# Patient Record
Sex: Female | Born: 1940 | Race: Black or African American | Hispanic: No | Marital: Married | Smoking: Never smoker
Health system: Southern US, Community
[De-identification: ages and names within clinical notes are randomized; demographics above are authoritative.]

## PROBLEM LIST (undated history)

## (undated) HISTORY — PX: THYROID SURGERY: SHX805

## (undated) HISTORY — PX: CORONARY ARTERY BYPASS GRAFT: SHX141

---

## 2019-02-04 ENCOUNTER — Emergency Department (HOSPITAL_BASED_OUTPATIENT_CLINIC_OR_DEPARTMENT_OTHER): Payer: Medicare HMO

## 2019-02-04 ENCOUNTER — Encounter (HOSPITAL_BASED_OUTPATIENT_CLINIC_OR_DEPARTMENT_OTHER): Payer: Self-pay

## 2019-02-04 ENCOUNTER — Emergency Department (HOSPITAL_COMMUNITY): Payer: Medicare HMO

## 2019-02-04 ENCOUNTER — Emergency Department (HOSPITAL_BASED_OUTPATIENT_CLINIC_OR_DEPARTMENT_OTHER)
Admission: EM | Admit: 2019-02-04 | Discharge: 2019-02-04 | Disposition: A | Discharge status: Home / Self Care | Payer: Medicare HMO | Attending: Emergency Medicine | Admitting: Emergency Medicine

## 2019-02-04 ENCOUNTER — Other Ambulatory Visit: Payer: Self-pay

## 2019-02-04 DIAGNOSIS — Z7901 Long term (current) use of anticoagulants: Secondary | ICD-10-CM | POA: Insufficient documentation

## 2019-02-04 DIAGNOSIS — M7989 Other specified soft tissue disorders: Secondary | ICD-10-CM

## 2019-02-04 DIAGNOSIS — Z951 Presence of aortocoronary bypass graft: Secondary | ICD-10-CM | POA: Diagnosis not present

## 2019-02-04 DIAGNOSIS — R2241 Localized swelling, mass and lump, right lower limb: Secondary | ICD-10-CM | POA: Insufficient documentation

## 2019-02-04 MED ORDER — HYDROCODONE-ACETAMINOPHEN 5-325 MG PO TABS
1.0000 | ORAL_TABLET | Freq: Once | ORAL | Status: AC
  Administered 2019-02-04: 1 via ORAL
  Filled 2019-02-04: qty 1

## 2019-02-04 NOTE — Discharge Instructions (Addendum)
Work-up for the right leg swelling x-rays negative for any bony injury.  Also Doppler study negative for any blood clots in the leg.  Results printed out for you follow-up with your doctor.

## 2019-02-04 NOTE — ED Triage Notes (Signed)
Pt c/o swelling to right LE x 6 days-noticed bluish discoloration x 4 days ago-denies injury to LE-NAD-steady gait

## 2019-02-04 NOTE — ED Provider Notes (Signed)
[facility] EMERGENCY DEPARTMENT Provider Note   CSN: [id number] Arrival date & time: 02/04/19  1717     History   Chief Complaint Chief Complaint  Patient presents with   Leg Swelling    HPI Wanda Grant is a 78 y.o. female.     Patient seen by her primary care doctor in the office this afternoon.  The concern is for some right leg swelling and sort of a bruising or bluish discolored station.  Patient denies any injuries.  Symptoms been present for 4 to 6 days.  Patient able to ambulate on it.  No chest pain no shortness of breath.  Patient is on the blood thinner Xarelto according to her for atrial fibrillation.     History reviewed. No pertinent past medical history.  There are no active problems to display for this patient.   Past Surgical History:  Procedure Laterality Date   CORONARY ARTERY BYPASS GRAFT     THYROID SURGERY       OB History   No obstetric history on file.      Home Medications    Prior to Admission medications   Not on File    Family History No family history on file.  Social History Social History   Tobacco Use   Smoking status: Never Smoker   Smokeless tobacco: Never Used  Substance Use Topics   Alcohol use: Never    Frequency: Never   Drug use: Never     Allergies   Patient has no known allergies.   Review of Systems Review of Systems  Constitutional: Negative for chills and fever.  HENT: Negative for congestion, rhinorrhea and sore throat.   Eyes: Negative for visual disturbance.  Respiratory: Negative for cough and shortness of breath.   Cardiovascular: Positive for leg swelling. Negative for chest pain.  Gastrointestinal: Negative for abdominal pain, diarrhea, nausea and vomiting.  Genitourinary: Negative for dysuria.  Musculoskeletal: Negative for back pain and neck pain.  Skin: Negative for rash.  Neurological: Negative for dizziness, light-headedness and headaches.  Hematological:  Bruises/bleeds easily.  Psychiatric/Behavioral: Negative for confusion.     Physical Exam Updated Vital Signs BP 133/88 (BP Location: Left Arm)    Pulse 88    Temp 97.8 F (36.6 C) (Oral)    Resp 20    Ht 1.702 m (5\' 7" )    Wt 73 kg    SpO2 98%    BMI 25.22 kg/m   Physical Exam Vitals signs and nursing note reviewed.  Constitutional:      General: She is not in acute distress.    Appearance: Normal appearance. She is well-developed.  HENT:     Head: Normocephalic and atraumatic.  Eyes:     Extraocular Movements: Extraocular movements intact.     Conjunctiva/sclera: Conjunctivae normal.     Pupils: Pupils are equal, round, and reactive to light.  Neck:     Musculoskeletal: Normal range of motion and neck supple.  Cardiovascular:     Rate and Rhythm: Normal rate and regular rhythm.     Heart sounds: No murmur.  Pulmonary:     Effort: Pulmonary effort is normal. No respiratory distress.     Breath sounds: Normal breath sounds.  Abdominal:     Palpations: Abdomen is soft.     Tenderness: There is no abdominal tenderness.  Musculoskeletal:        General: Swelling present. No tenderness.     Right lower leg: Edema present.  Comments: No swelling to the left lower extremity.  There is swelling to the right lower extremity around the ankle and foot and also up into the calf area.  There is some ecchymosis or bruising around the ankle area.  Good dorsalis pedis pulse.  Good cap refill to the toes.  No tenderness to palpation.  So no proximal leg tenderness either.  No swelling of the knee.  Skin:    General: Skin is warm and dry.     Capillary Refill: Capillary refill takes less than 2 seconds.  Neurological:     General: No focal deficit present.     Mental Status: She is alert and oriented to person, place, and time.      ED Treatments / Results  Labs (all labs ordered are listed, but only abnormal results are displayed) Labs Reviewed - No data to  display  EKG None  Radiology Dg Ankle Complete Right  Result Date: 02/04/2019 CLINICAL DATA:  Right lower extremity swelling.  No reported injury. EXAM: RIGHT ANKLE - COMPLETE 3+ VIEW COMPARISON:  None. FINDINGS: Diffuse soft tissue swelling. No fracture or subluxation. No suspicious focal osseous lesion. No osseous erosions. Vascular calcifications. No radiopaque foreign body. IMPRESSION: Diffuse soft tissue swelling, with no acute osseous abnormality. Electronically Signed   By: Delbert PhenixJason A Poff M.D.   On: 02/04/2019 18:21   [place] Venous Img Lower Unilateral Right  Result Date: 02/04/2019 CLINICAL DATA:  Right lower extremity pain and swelling EXAM: RIGHT LOWER EXTREMITY VENOUS DOPPLER ULTRASOUND TECHNIQUE: Gray-scale sonography with graded compression, as well as color Doppler and duplex ultrasound were performed to evaluate the lower extremity deep venous systems from the level of the common femoral vein and including the common femoral, femoral, profunda femoral, popliteal and calf veins including the posterior tibial, peroneal and gastrocnemius veins when visible. The superficial great saphenous vein was also interrogated. Spectral Doppler was utilized to evaluate flow at rest and with distal augmentation maneuvers in the common femoral, femoral and popliteal veins. COMPARISON:  None. FINDINGS: Contralateral Common Femoral Vein: Respiratory phasicity is normal and symmetric with the symptomatic side. No evidence of thrombus. Normal compressibility. Common Femoral Vein: No evidence of thrombus. Normal compressibility, respiratory phasicity and response to augmentation. Saphenofemoral Junction: No evidence of thrombus. Normal compressibility and flow on color Doppler imaging. Profunda Femoral Vein: No evidence of thrombus. Normal compressibility and flow on color Doppler imaging. Femoral Vein: No evidence of thrombus. Normal compressibility, respiratory phasicity and response to augmentation. Popliteal  Vein: No evidence of thrombus. Normal compressibility, respiratory phasicity and response to augmentation. Calf Veins: No evidence of thrombus. Normal compressibility and flow on color Doppler imaging. Superficial Great Saphenous Vein: No evidence of thrombus. Normal compressibility. Venous Reflux:  None. Other Findings:  None. IMPRESSION: No evidence of deep venous thrombosis. Electronically Signed   By: Jonna ClarkBindu  Avutu M.D.   On: 02/04/2019 19:30    Procedures Procedures (including critical care time)  Medications Ordered in ED Medications  HYDROcodone-acetaminophen (NORCO/VICODIN) 5-325 MG per tablet 1 tablet (1 tablet Oral Given 02/04/19 1815)     Initial Impression / Assessment and Plan / ED Course  I have reviewed the triage vital signs and the nursing notes.  Pertinent labs & imaging results that were available during my care of the patient were reviewed by me and considered in my medical decision making (see chart for details).        Work-up for the right leg swelling.  Doppler studies negative for DVT.  X-ray  of the ankle shows no bony injury.  No real tenderness does not seem to be consistent with an ankle sprain.  Patient is on the blood thinner Xarelto.  The ecchymosis ankle could be secondary to some bleeding into the skin area.  But is not real intense or significant.  Will have patient follow back up with primary care doctor for further evaluation.  But no evidence of deep vein thrombosis and no evidence of any bony injury.  Final Clinical Impressions(s) / ED Diagnoses   Final diagnoses:  Leg swelling    ED Discharge Orders    None       Fredia Sorrow, MD 02/04/19 2011

## 2020-07-13 IMAGING — DX RIGHT ANKLE - COMPLETE 3+ VIEW
3 series · 3 of 3 positions shown · non-contrast
Comparison: None.

CLINICAL DATA: Right lower extremity swelling.  No reported injury.

EXAM:
RIGHT ANKLE - COMPLETE 3+ VIEW

[ankle ap]
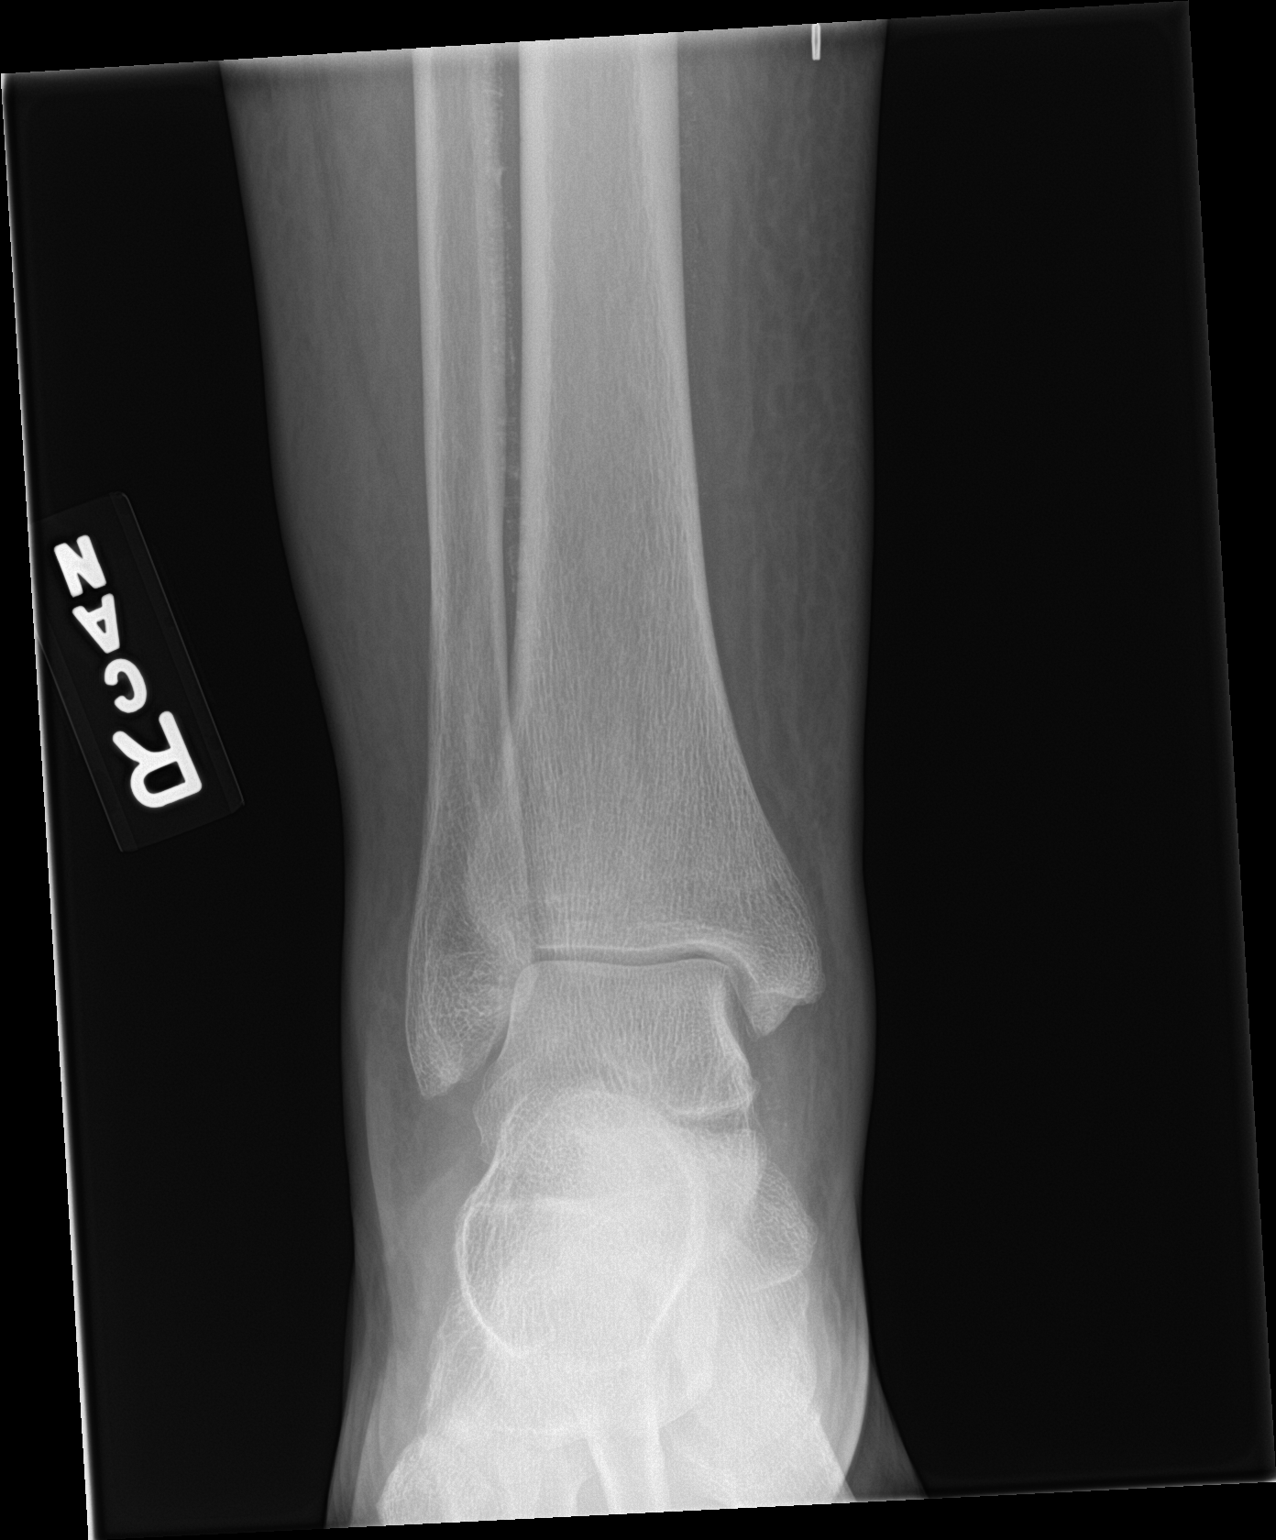

[ankle obl]
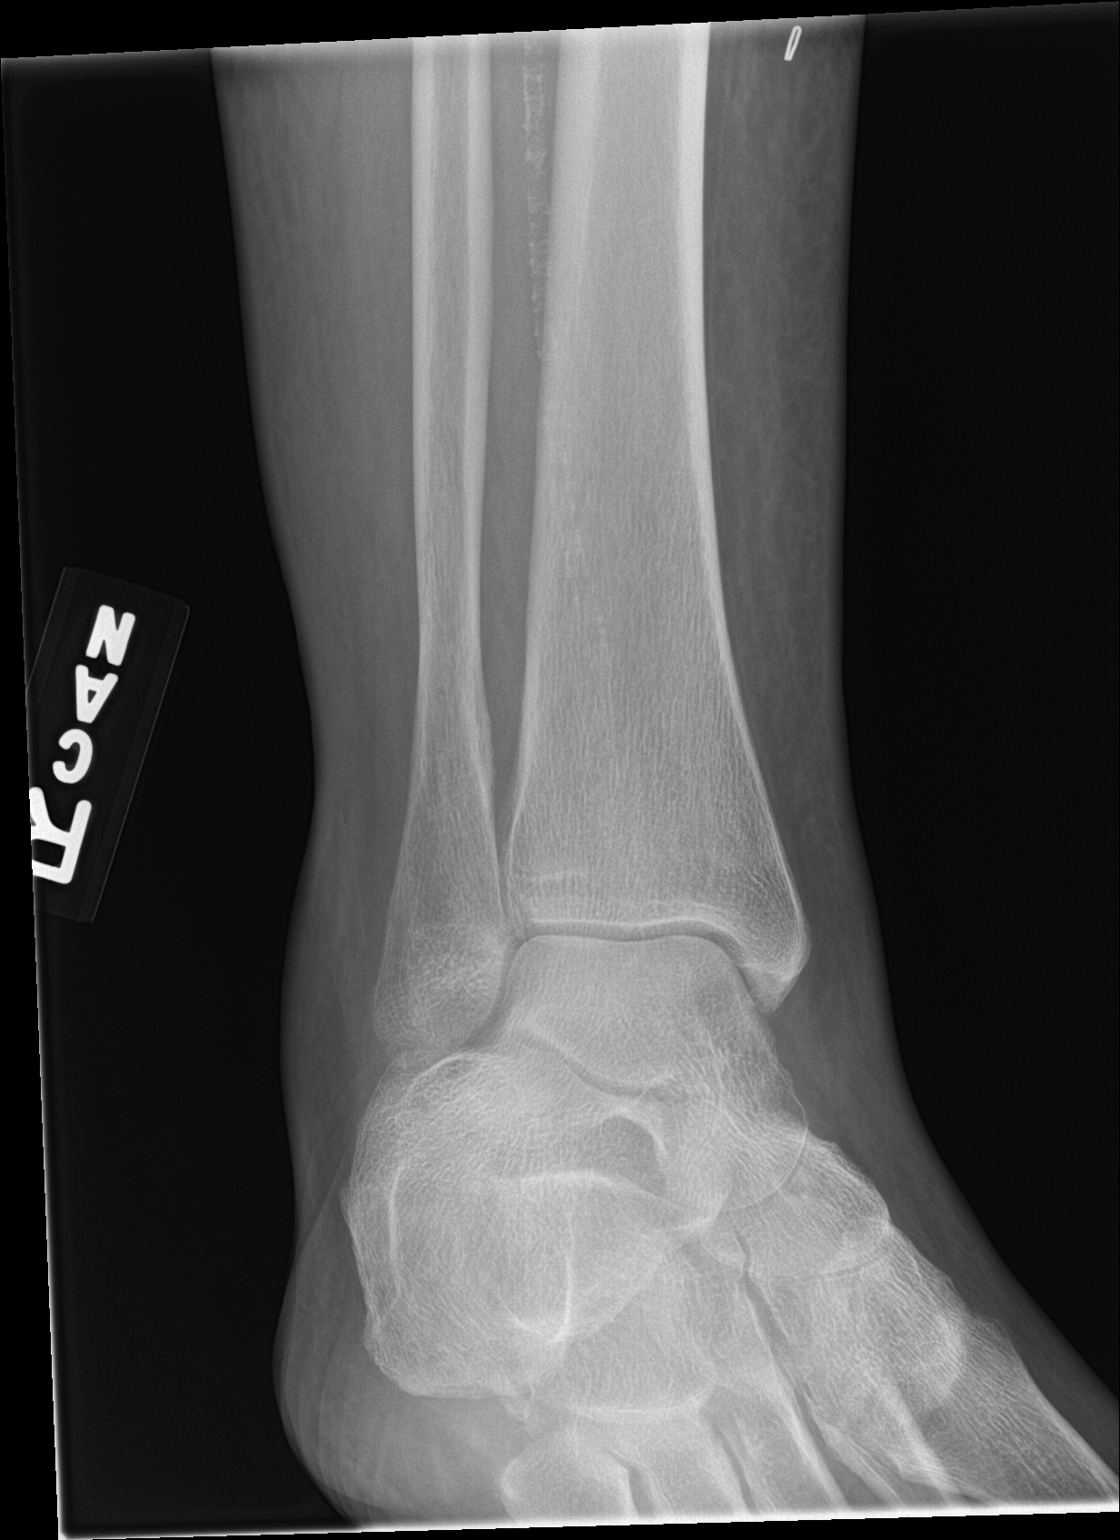

[ankle lat]
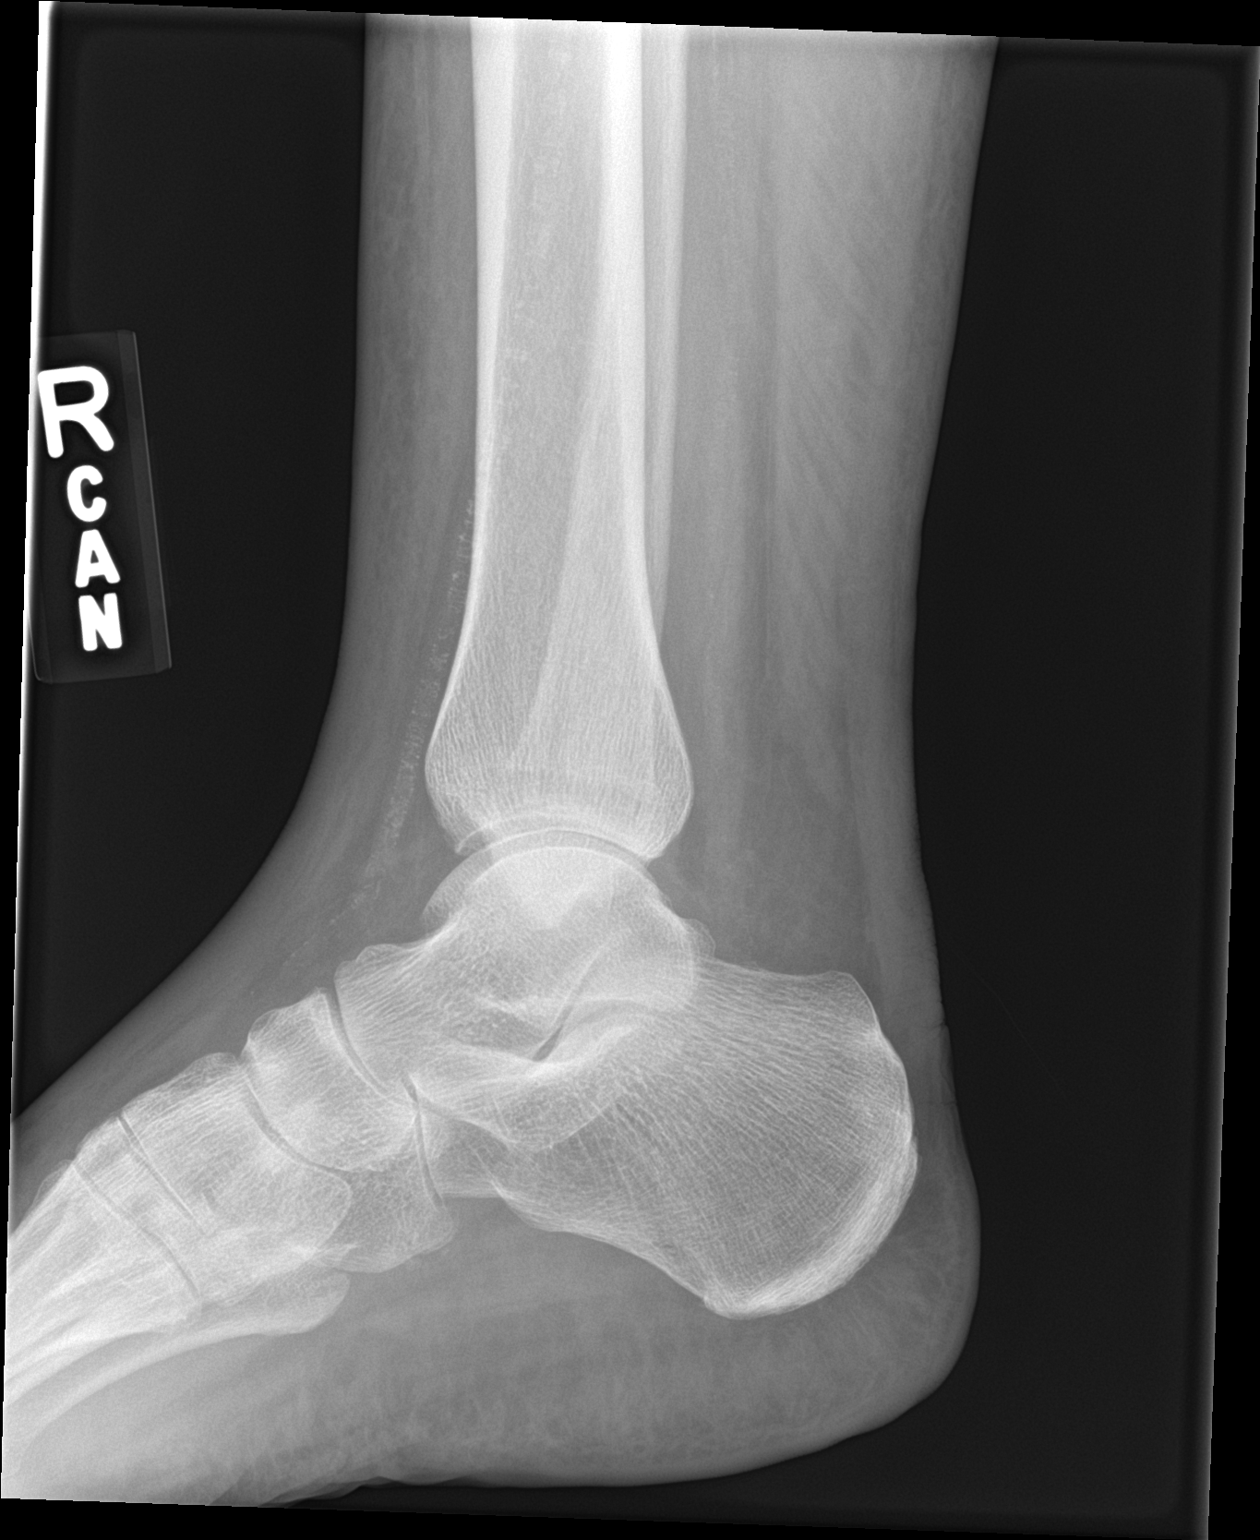

[3 of 3 positions shown; findings below may reference images not displayed]

FINDINGS: Diffuse soft tissue swelling. No fracture or subluxation. No
suspicious focal osseous lesion. No osseous erosions. Vascular
calcifications. No radiopaque foreign body.
IMPRESSION: Diffuse soft tissue swelling, with no acute osseous abnormality.

## 2020-07-13 IMAGING — US RIGHT LOWER EXTREMITY VENOUS ULTRASOUND
1 series · 13 of 24 positions shown · non-contrast
Comparison: None.

CLINICAL DATA: Right lower extremity pain and swelling



[Series 1: right lower extremity venous ultrasound · 13 of 33 slices shown]
[im 1/33]
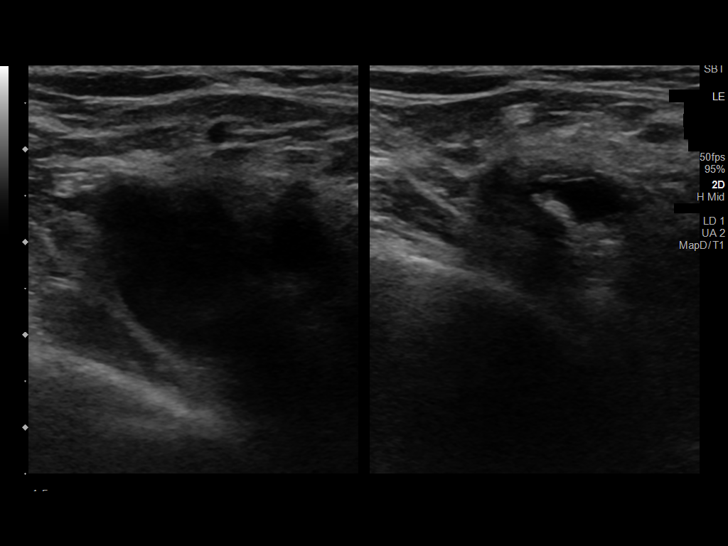
[im 3/33]
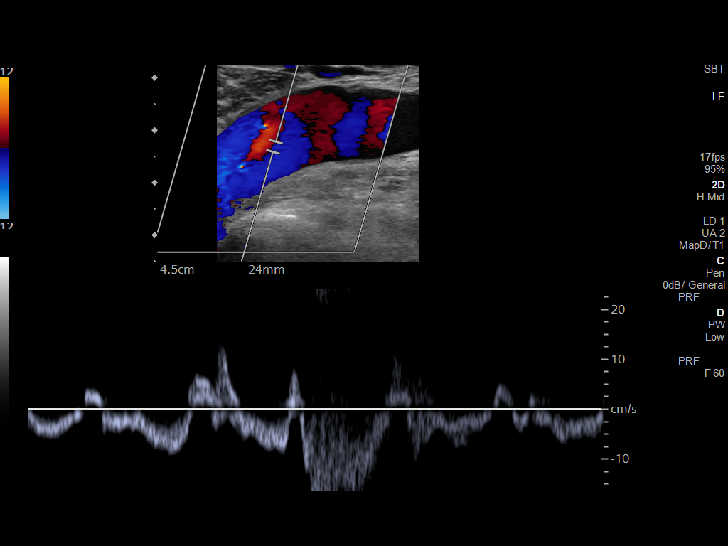
[im 6/33]
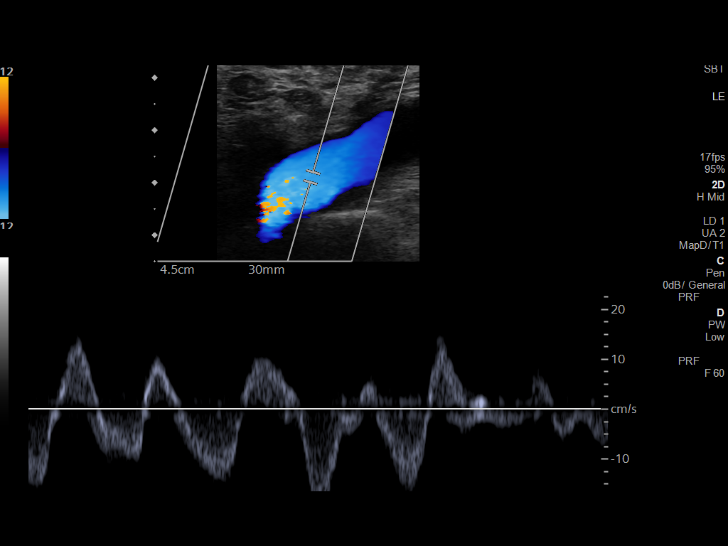
[im 9/33]
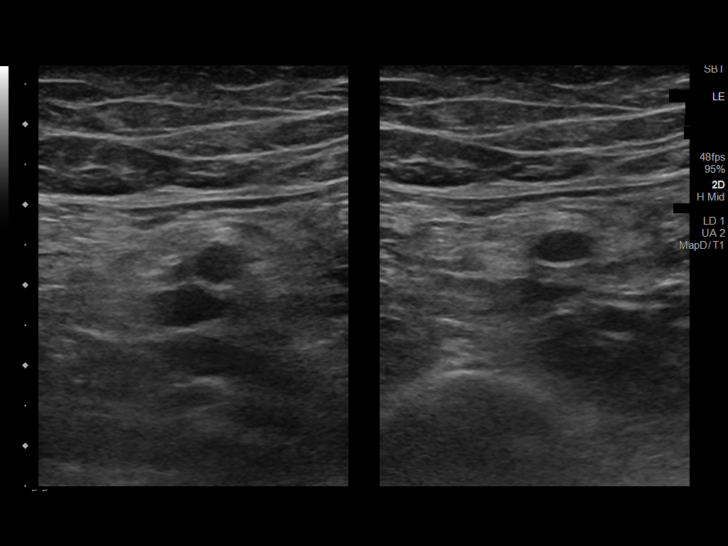
[im 12/33]
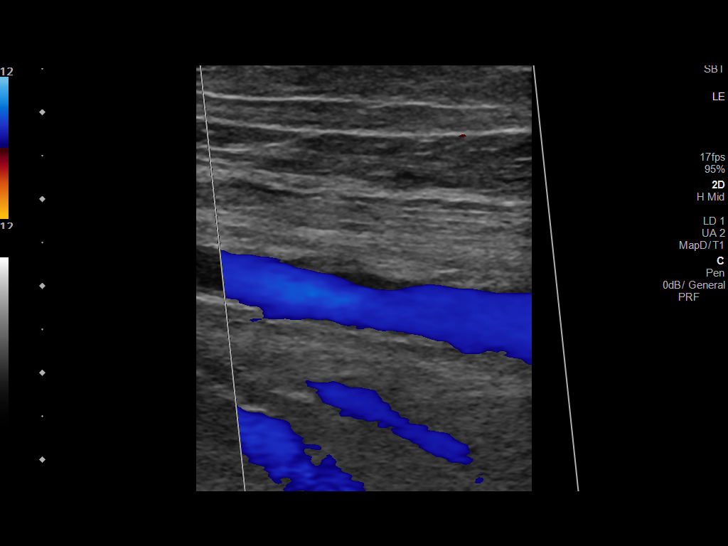
[im 14/33]
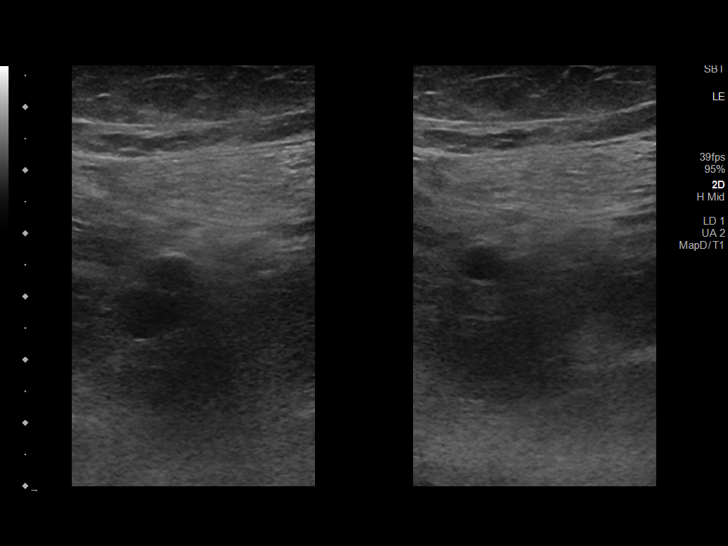
[im 17/33]
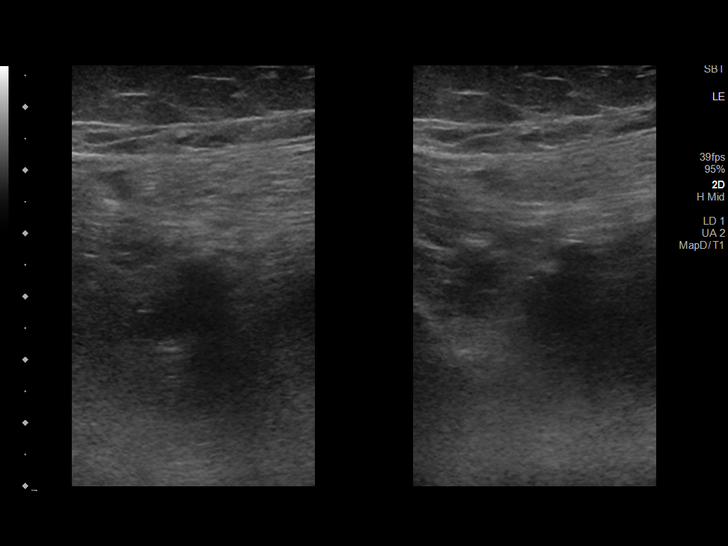
[im 19/33]
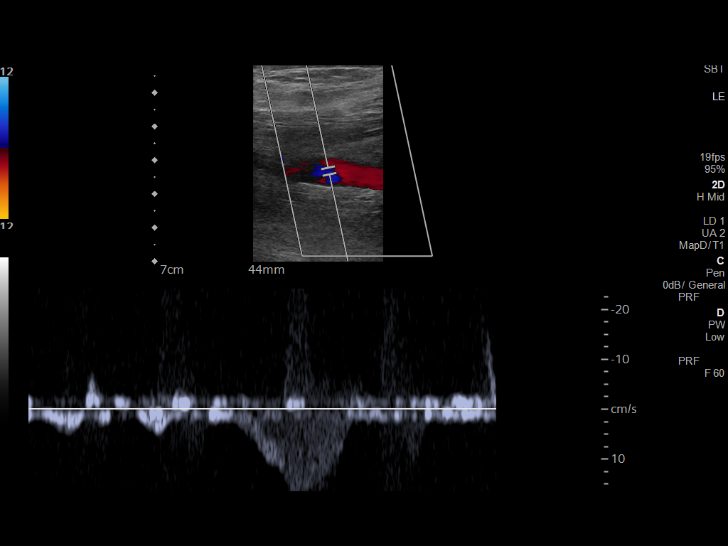
[im 21/33]
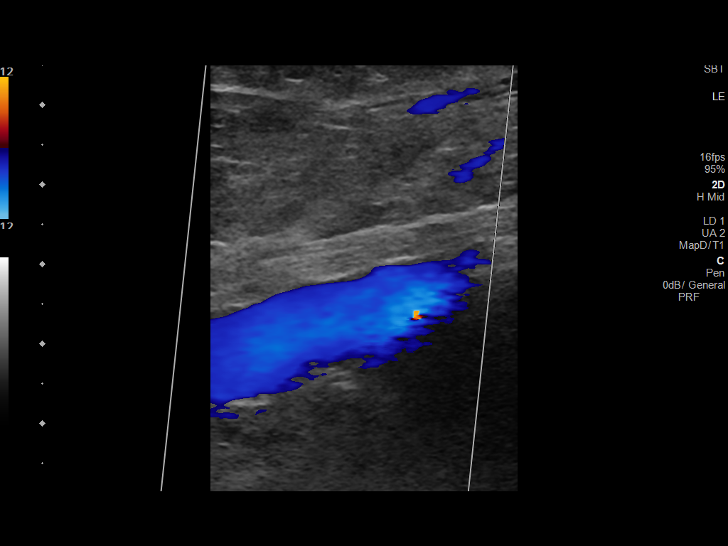
[im 24/33]
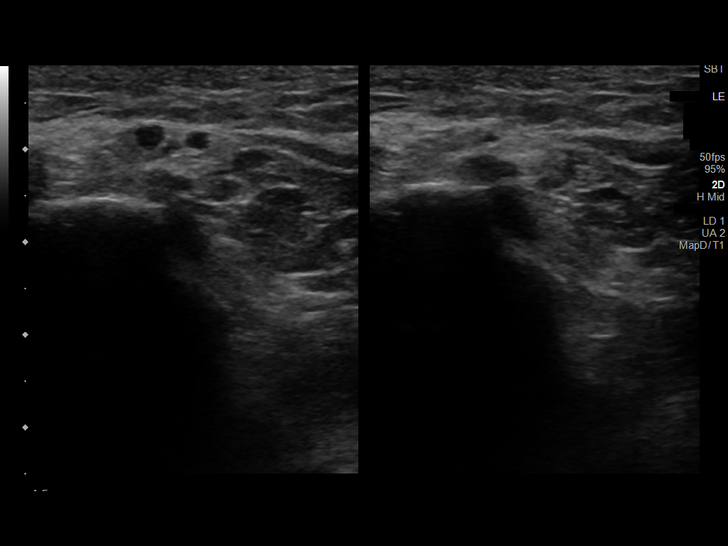
[im 27/33]
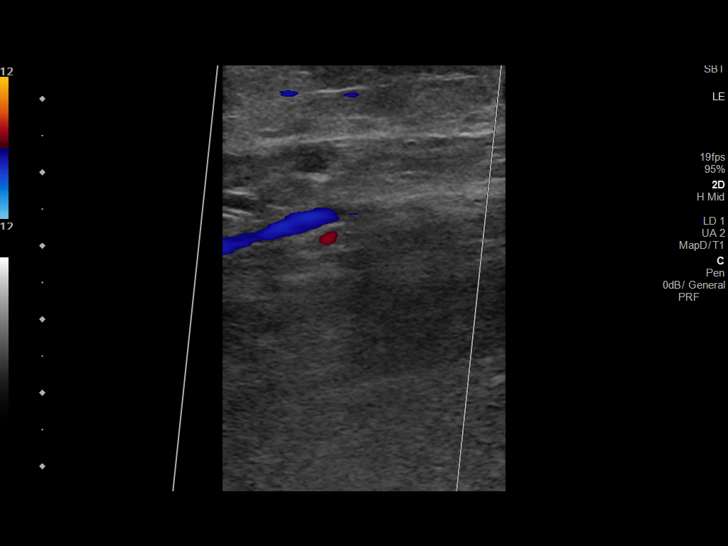
[im 30/33]
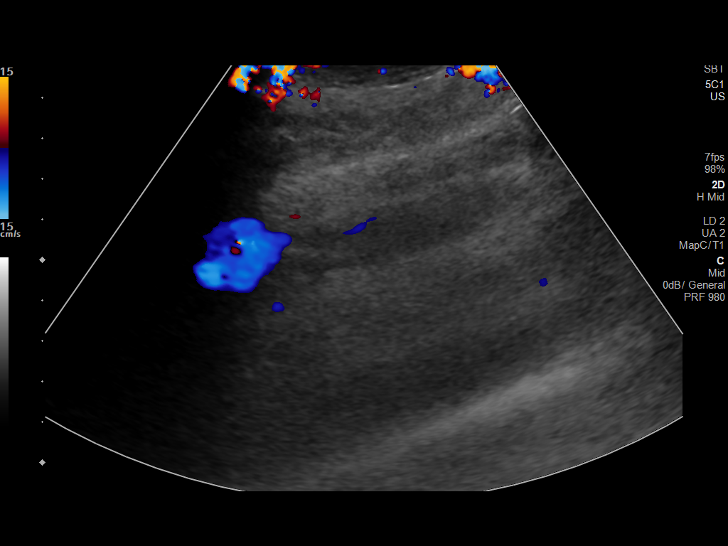
[im 33/33]
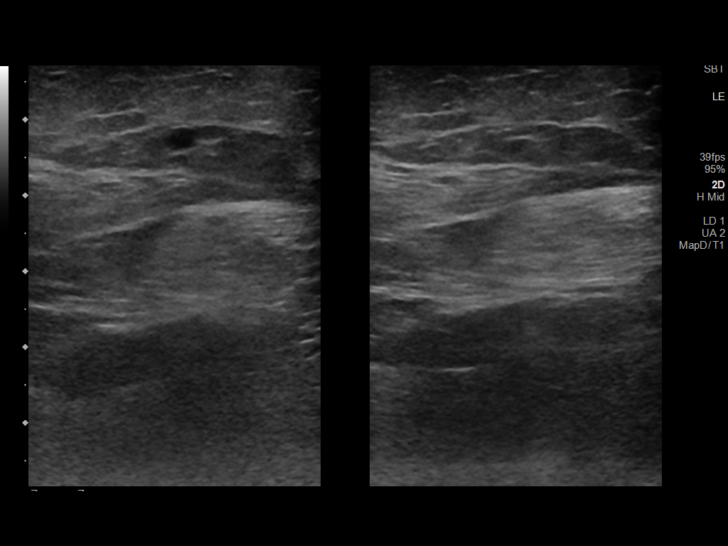

[13 of 24 positions shown; findings below may reference images not displayed]

FINDINGS: Contralateral Common Femoral Vein: Respiratory phasicity is normal
and symmetric with the symptomatic side. No evidence of thrombus.
Normal compressibility.

Common Femoral Vein: No evidence of thrombus. Normal
compressibility, respiratory phasicity and response to augmentation.

Saphenofemoral Junction: No evidence of thrombus. Normal
compressibility and flow on color Doppler imaging.

Profunda Femoral Vein: No evidence of thrombus. Normal
compressibility and flow on color Doppler imaging.

Femoral Vein: No evidence of thrombus. Normal compressibility,
respiratory phasicity and response to augmentation.

Popliteal Vein: No evidence of thrombus. Normal compressibility,
respiratory phasicity and response to augmentation.

Calf Veins: No evidence of thrombus. Normal compressibility and flow
on color Doppler imaging.

Superficial Great Saphenous Vein: No evidence of thrombus. Normal
compressibility.

Venous Reflux:  None.

Other Findings:  None.
IMPRESSION: No evidence of deep venous thrombosis.

## 2021-08-23 DIAGNOSIS — E1122 Type 2 diabetes mellitus with diabetic chronic kidney disease: Secondary | ICD-10-CM | POA: Diagnosis not present

## 2021-08-23 DIAGNOSIS — N1832 Chronic kidney disease, stage 3b: Secondary | ICD-10-CM | POA: Diagnosis not present

## 2021-08-23 DIAGNOSIS — Z7984 Long term (current) use of oral hypoglycemic drugs: Secondary | ICD-10-CM | POA: Diagnosis not present

## 2021-08-23 DIAGNOSIS — I129 Hypertensive chronic kidney disease with stage 1 through stage 4 chronic kidney disease, or unspecified chronic kidney disease: Secondary | ICD-10-CM | POA: Diagnosis not present

## 2021-08-28 DIAGNOSIS — I4821 Permanent atrial fibrillation: Secondary | ICD-10-CM | POA: Diagnosis not present

## 2021-08-28 DIAGNOSIS — Z45018 Encounter for adjustment and management of other part of cardiac pacemaker: Secondary | ICD-10-CM | POA: Diagnosis not present

## 2021-09-08 DIAGNOSIS — I129 Hypertensive chronic kidney disease with stage 1 through stage 4 chronic kidney disease, or unspecified chronic kidney disease: Secondary | ICD-10-CM | POA: Diagnosis not present

## 2021-09-08 DIAGNOSIS — N183 Chronic kidney disease, stage 3 unspecified: Secondary | ICD-10-CM | POA: Diagnosis not present

## 2021-09-08 DIAGNOSIS — N1832 Chronic kidney disease, stage 3b: Secondary | ICD-10-CM | POA: Diagnosis not present

## 2021-09-08 DIAGNOSIS — K219 Gastro-esophageal reflux disease without esophagitis: Secondary | ICD-10-CM | POA: Diagnosis not present

## 2021-09-08 DIAGNOSIS — E1122 Type 2 diabetes mellitus with diabetic chronic kidney disease: Secondary | ICD-10-CM | POA: Diagnosis not present

## 2021-09-08 DIAGNOSIS — E785 Hyperlipidemia, unspecified: Secondary | ICD-10-CM | POA: Diagnosis not present

## 2021-09-08 DIAGNOSIS — Z0000 Encounter for general adult medical examination without abnormal findings: Secondary | ICD-10-CM | POA: Diagnosis not present

## 2021-09-08 DIAGNOSIS — E039 Hypothyroidism, unspecified: Secondary | ICD-10-CM | POA: Diagnosis not present

## 2021-09-08 DIAGNOSIS — F0390 Unspecified dementia without behavioral disturbance: Secondary | ICD-10-CM | POA: Diagnosis not present

## 2021-09-08 DIAGNOSIS — I4821 Permanent atrial fibrillation: Secondary | ICD-10-CM | POA: Diagnosis not present

## 2021-09-19 DIAGNOSIS — R928 Other abnormal and inconclusive findings on diagnostic imaging of breast: Secondary | ICD-10-CM | POA: Diagnosis not present

## 2021-09-19 DIAGNOSIS — Z1231 Encounter for screening mammogram for malignant neoplasm of breast: Secondary | ICD-10-CM | POA: Diagnosis not present

## 2021-09-22 DIAGNOSIS — R928 Other abnormal and inconclusive findings on diagnostic imaging of breast: Secondary | ICD-10-CM | POA: Diagnosis not present

## 2021-09-22 DIAGNOSIS — Z0189 Encounter for other specified special examinations: Secondary | ICD-10-CM | POA: Diagnosis not present

## 2021-10-31 DIAGNOSIS — H401233 Low-tension glaucoma, bilateral, severe stage: Secondary | ICD-10-CM | POA: Diagnosis not present

## 2021-10-31 DIAGNOSIS — H26491 Other secondary cataract, right eye: Secondary | ICD-10-CM | POA: Diagnosis not present

## 2021-10-31 DIAGNOSIS — Z961 Presence of intraocular lens: Secondary | ICD-10-CM | POA: Diagnosis not present

## 2021-11-30 DIAGNOSIS — R197 Diarrhea, unspecified: Secondary | ICD-10-CM | POA: Diagnosis not present

## 2021-11-30 DIAGNOSIS — I129 Hypertensive chronic kidney disease with stage 1 through stage 4 chronic kidney disease, or unspecified chronic kidney disease: Secondary | ICD-10-CM | POA: Diagnosis not present

## 2021-11-30 DIAGNOSIS — E039 Hypothyroidism, unspecified: Secondary | ICD-10-CM | POA: Diagnosis not present

## 2021-11-30 DIAGNOSIS — N1832 Chronic kidney disease, stage 3b: Secondary | ICD-10-CM | POA: Diagnosis not present

## 2021-11-30 DIAGNOSIS — E1122 Type 2 diabetes mellitus with diabetic chronic kidney disease: Secondary | ICD-10-CM | POA: Diagnosis not present

## 2021-11-30 DIAGNOSIS — N183 Chronic kidney disease, stage 3 unspecified: Secondary | ICD-10-CM | POA: Diagnosis not present

## 2021-12-11 DIAGNOSIS — N1832 Chronic kidney disease, stage 3b: Secondary | ICD-10-CM | POA: Diagnosis not present

## 2021-12-11 DIAGNOSIS — I4821 Permanent atrial fibrillation: Secondary | ICD-10-CM | POA: Diagnosis not present

## 2021-12-11 DIAGNOSIS — N183 Chronic kidney disease, stage 3 unspecified: Secondary | ICD-10-CM | POA: Diagnosis not present

## 2021-12-11 DIAGNOSIS — I130 Hypertensive heart and chronic kidney disease with heart failure and stage 1 through stage 4 chronic kidney disease, or unspecified chronic kidney disease: Secondary | ICD-10-CM | POA: Diagnosis not present

## 2021-12-11 DIAGNOSIS — Z45018 Encounter for adjustment and management of other part of cardiac pacemaker: Secondary | ICD-10-CM | POA: Diagnosis not present

## 2021-12-11 DIAGNOSIS — E1122 Type 2 diabetes mellitus with diabetic chronic kidney disease: Secondary | ICD-10-CM | POA: Diagnosis not present

## 2021-12-11 DIAGNOSIS — I25118 Atherosclerotic heart disease of native coronary artery with other forms of angina pectoris: Secondary | ICD-10-CM | POA: Diagnosis not present

## 2021-12-11 DIAGNOSIS — Z951 Presence of aortocoronary bypass graft: Secondary | ICD-10-CM | POA: Diagnosis not present

## 2021-12-11 DIAGNOSIS — I447 Left bundle-branch block, unspecified: Secondary | ICD-10-CM | POA: Diagnosis not present

## 2021-12-11 DIAGNOSIS — Z953 Presence of xenogenic heart valve: Secondary | ICD-10-CM | POA: Diagnosis not present

## 2021-12-11 DIAGNOSIS — I5022 Chronic systolic (congestive) heart failure: Secondary | ICD-10-CM | POA: Diagnosis not present

## 2021-12-12 DIAGNOSIS — M7062 Trochanteric bursitis, left hip: Secondary | ICD-10-CM | POA: Diagnosis not present

## 2021-12-12 DIAGNOSIS — N183 Chronic kidney disease, stage 3 unspecified: Secondary | ICD-10-CM | POA: Diagnosis not present

## 2021-12-12 DIAGNOSIS — I129 Hypertensive chronic kidney disease with stage 1 through stage 4 chronic kidney disease, or unspecified chronic kidney disease: Secondary | ICD-10-CM | POA: Diagnosis not present

## 2021-12-12 DIAGNOSIS — N1832 Chronic kidney disease, stage 3b: Secondary | ICD-10-CM | POA: Diagnosis not present

## 2021-12-12 DIAGNOSIS — M545 Low back pain, unspecified: Secondary | ICD-10-CM | POA: Diagnosis not present

## 2021-12-12 DIAGNOSIS — E1122 Type 2 diabetes mellitus with diabetic chronic kidney disease: Secondary | ICD-10-CM | POA: Diagnosis not present

## 2021-12-12 DIAGNOSIS — E785 Hyperlipidemia, unspecified: Secondary | ICD-10-CM | POA: Diagnosis not present

## 2021-12-13 DIAGNOSIS — Z4509 Encounter for adjustment and management of other cardiac device: Secondary | ICD-10-CM | POA: Diagnosis not present

## 2021-12-13 DIAGNOSIS — I447 Left bundle-branch block, unspecified: Secondary | ICD-10-CM | POA: Diagnosis not present

## 2021-12-13 DIAGNOSIS — Z952 Presence of prosthetic heart valve: Secondary | ICD-10-CM | POA: Diagnosis not present

## 2021-12-13 DIAGNOSIS — Z951 Presence of aortocoronary bypass graft: Secondary | ICD-10-CM | POA: Diagnosis not present

## 2021-12-13 DIAGNOSIS — I4821 Permanent atrial fibrillation: Secondary | ICD-10-CM | POA: Diagnosis not present

## 2021-12-13 DIAGNOSIS — Z950 Presence of cardiac pacemaker: Secondary | ICD-10-CM | POA: Diagnosis not present

## 2021-12-22 DIAGNOSIS — N1832 Chronic kidney disease, stage 3b: Secondary | ICD-10-CM | POA: Diagnosis not present

## 2021-12-27 DIAGNOSIS — I129 Hypertensive chronic kidney disease with stage 1 through stage 4 chronic kidney disease, or unspecified chronic kidney disease: Secondary | ICD-10-CM | POA: Diagnosis not present

## 2021-12-27 DIAGNOSIS — N1832 Chronic kidney disease, stage 3b: Secondary | ICD-10-CM | POA: Diagnosis not present

## 2021-12-27 DIAGNOSIS — Z7984 Long term (current) use of oral hypoglycemic drugs: Secondary | ICD-10-CM | POA: Diagnosis not present

## 2021-12-27 DIAGNOSIS — E1122 Type 2 diabetes mellitus with diabetic chronic kidney disease: Secondary | ICD-10-CM | POA: Diagnosis not present

## 2022-01-11 DIAGNOSIS — M7062 Trochanteric bursitis, left hip: Secondary | ICD-10-CM | POA: Diagnosis not present

## 2022-01-11 DIAGNOSIS — F0390 Unspecified dementia without behavioral disturbance: Secondary | ICD-10-CM | POA: Diagnosis not present

## 2022-01-11 DIAGNOSIS — N1832 Chronic kidney disease, stage 3b: Secondary | ICD-10-CM | POA: Diagnosis not present

## 2022-01-11 DIAGNOSIS — Z79899 Other long term (current) drug therapy: Secondary | ICD-10-CM | POA: Diagnosis not present

## 2022-01-11 DIAGNOSIS — E1122 Type 2 diabetes mellitus with diabetic chronic kidney disease: Secondary | ICD-10-CM | POA: Diagnosis not present

## 2022-02-08 DIAGNOSIS — H1131 Conjunctival hemorrhage, right eye: Secondary | ICD-10-CM | POA: Diagnosis not present

## 2022-02-13 DIAGNOSIS — I25118 Atherosclerotic heart disease of native coronary artery with other forms of angina pectoris: Secondary | ICD-10-CM | POA: Diagnosis not present

## 2022-02-13 DIAGNOSIS — I4821 Permanent atrial fibrillation: Secondary | ICD-10-CM | POA: Diagnosis not present

## 2022-02-13 DIAGNOSIS — I350 Nonrheumatic aortic (valve) stenosis: Secondary | ICD-10-CM | POA: Diagnosis not present

## 2022-02-13 DIAGNOSIS — I6523 Occlusion and stenosis of bilateral carotid arteries: Secondary | ICD-10-CM | POA: Diagnosis not present

## 2022-02-13 DIAGNOSIS — N1832 Chronic kidney disease, stage 3b: Secondary | ICD-10-CM | POA: Diagnosis not present

## 2022-02-13 DIAGNOSIS — E1122 Type 2 diabetes mellitus with diabetic chronic kidney disease: Secondary | ICD-10-CM | POA: Diagnosis not present

## 2022-02-13 DIAGNOSIS — Z953 Presence of xenogenic heart valve: Secondary | ICD-10-CM | POA: Diagnosis not present

## 2022-02-13 DIAGNOSIS — I110 Hypertensive heart disease with heart failure: Secondary | ICD-10-CM | POA: Diagnosis not present

## 2022-02-13 DIAGNOSIS — I5022 Chronic systolic (congestive) heart failure: Secondary | ICD-10-CM | POA: Diagnosis not present

## 2022-02-13 DIAGNOSIS — Z8673 Personal history of transient ischemic attack (TIA), and cerebral infarction without residual deficits: Secondary | ICD-10-CM | POA: Diagnosis not present

## 2022-02-13 DIAGNOSIS — Z7901 Long term (current) use of anticoagulants: Secondary | ICD-10-CM | POA: Diagnosis not present

## 2022-02-13 DIAGNOSIS — Z951 Presence of aortocoronary bypass graft: Secondary | ICD-10-CM | POA: Diagnosis not present

## 2022-04-27 DIAGNOSIS — N1832 Chronic kidney disease, stage 3b: Secondary | ICD-10-CM | POA: Diagnosis not present

## 2022-04-27 DIAGNOSIS — E1122 Type 2 diabetes mellitus with diabetic chronic kidney disease: Secondary | ICD-10-CM | POA: Diagnosis not present

## 2022-05-01 DIAGNOSIS — E1122 Type 2 diabetes mellitus with diabetic chronic kidney disease: Secondary | ICD-10-CM | POA: Diagnosis not present

## 2022-05-01 DIAGNOSIS — Z7984 Long term (current) use of oral hypoglycemic drugs: Secondary | ICD-10-CM | POA: Diagnosis not present

## 2022-05-01 DIAGNOSIS — E11319 Type 2 diabetes mellitus with unspecified diabetic retinopathy without macular edema: Secondary | ICD-10-CM | POA: Diagnosis not present

## 2022-05-01 DIAGNOSIS — N1832 Chronic kidney disease, stage 3b: Secondary | ICD-10-CM | POA: Diagnosis not present

## 2022-05-01 DIAGNOSIS — I509 Heart failure, unspecified: Secondary | ICD-10-CM | POA: Diagnosis not present

## 2022-05-01 DIAGNOSIS — I130 Hypertensive heart and chronic kidney disease with heart failure and stage 1 through stage 4 chronic kidney disease, or unspecified chronic kidney disease: Secondary | ICD-10-CM | POA: Diagnosis not present

## 2022-05-15 DIAGNOSIS — I129 Hypertensive chronic kidney disease with stage 1 through stage 4 chronic kidney disease, or unspecified chronic kidney disease: Secondary | ICD-10-CM | POA: Diagnosis not present

## 2022-05-15 DIAGNOSIS — Z23 Encounter for immunization: Secondary | ICD-10-CM | POA: Diagnosis not present

## 2022-05-15 DIAGNOSIS — E039 Hypothyroidism, unspecified: Secondary | ICD-10-CM | POA: Diagnosis not present

## 2022-05-15 DIAGNOSIS — E1122 Type 2 diabetes mellitus with diabetic chronic kidney disease: Secondary | ICD-10-CM | POA: Diagnosis not present

## 2022-05-15 DIAGNOSIS — N1832 Chronic kidney disease, stage 3b: Secondary | ICD-10-CM | POA: Diagnosis not present

## 2022-05-15 DIAGNOSIS — M545 Low back pain, unspecified: Secondary | ICD-10-CM | POA: Diagnosis not present

## 2022-05-15 DIAGNOSIS — E785 Hyperlipidemia, unspecified: Secondary | ICD-10-CM | POA: Diagnosis not present

## 2022-05-28 DIAGNOSIS — Z45010 Encounter for checking and testing of cardiac pacemaker pulse generator [battery]: Secondary | ICD-10-CM | POA: Diagnosis not present

## 2022-06-05 DIAGNOSIS — M160 Bilateral primary osteoarthritis of hip: Secondary | ICD-10-CM | POA: Diagnosis not present

## 2022-06-05 DIAGNOSIS — M8588 Other specified disorders of bone density and structure, other site: Secondary | ICD-10-CM | POA: Diagnosis not present

## 2022-06-05 DIAGNOSIS — M25552 Pain in left hip: Secondary | ICD-10-CM | POA: Diagnosis not present

## 2022-06-05 DIAGNOSIS — R300 Dysuria: Secondary | ICD-10-CM | POA: Diagnosis not present

## 2022-06-05 DIAGNOSIS — M545 Low back pain, unspecified: Secondary | ICD-10-CM | POA: Diagnosis not present

## 2022-06-05 DIAGNOSIS — M419 Scoliosis, unspecified: Secondary | ICD-10-CM | POA: Diagnosis not present

## 2022-06-05 DIAGNOSIS — M4316 Spondylolisthesis, lumbar region: Secondary | ICD-10-CM | POA: Diagnosis not present

## 2023-04-26 DEATH — deceased
